# Patient Record
Sex: Male | Born: 1990 | Race: White | Hispanic: No | Marital: Married | Smoking: Current every day smoker
Health system: Southern US, Community
[De-identification: ages and names within clinical notes are randomized; demographics above are authoritative.]

## PROBLEM LIST (undated history)

## (undated) DIAGNOSIS — J45909 Unspecified asthma, uncomplicated: Secondary | ICD-10-CM

## (undated) DIAGNOSIS — F259 Schizoaffective disorder, unspecified: Secondary | ICD-10-CM

---

## 2018-10-05 ENCOUNTER — Other Ambulatory Visit: Payer: Self-pay

## 2018-10-05 ENCOUNTER — Emergency Department
Admission: EM | Admit: 2018-10-05 | Discharge: 2018-10-05 | Discharge status: Against Medical Advice | Payer: Medicaid Other | Attending: Emergency Medicine | Admitting: Emergency Medicine

## 2018-10-05 DIAGNOSIS — H5712 Ocular pain, left eye: Secondary | ICD-10-CM | POA: Insufficient documentation

## 2018-10-05 DIAGNOSIS — Z5321 Procedure and treatment not carried out due to patient leaving prior to being seen by health care provider: Secondary | ICD-10-CM | POA: Diagnosis not present

## 2018-10-05 NOTE — ED Triage Notes (Signed)
Pt has redness and pain in left eye.  Pt unsure if bug flew into eye today.  Pt alert.

## 2018-10-05 NOTE — ED Notes (Signed)
Right eye: 20/50  Left eye: 20/70

## 2018-10-08 ENCOUNTER — Emergency Department
Admission: EM | Admit: 2018-10-08 | Discharge: 2018-10-08 | Disposition: A | Discharge status: Home / Self Care | Payer: Medicaid Other | Attending: Emergency Medicine | Admitting: Emergency Medicine

## 2018-10-08 ENCOUNTER — Other Ambulatory Visit: Payer: Self-pay

## 2018-10-08 ENCOUNTER — Emergency Department: Payer: Medicaid Other

## 2018-10-08 DIAGNOSIS — W010XXA Fall on same level from slipping, tripping and stumbling without subsequent striking against object, initial encounter: Secondary | ICD-10-CM | POA: Diagnosis not present

## 2018-10-08 DIAGNOSIS — S99921A Unspecified injury of right foot, initial encounter: Secondary | ICD-10-CM | POA: Diagnosis present

## 2018-10-08 DIAGNOSIS — Y9389 Activity, other specified: Secondary | ICD-10-CM | POA: Insufficient documentation

## 2018-10-08 DIAGNOSIS — Y998 Other external cause status: Secondary | ICD-10-CM | POA: Insufficient documentation

## 2018-10-08 DIAGNOSIS — Y92828 Other wilderness area as the place of occurrence of the external cause: Secondary | ICD-10-CM | POA: Insufficient documentation

## 2018-10-08 DIAGNOSIS — M79671 Pain in right foot: Secondary | ICD-10-CM

## 2018-10-08 MED ORDER — MELOXICAM 15 MG PO TABS: 15.0000 mg | ORAL_TABLET | Freq: Every day | ORAL | 1 refills | Status: AC

## 2018-10-08 NOTE — ED Triage Notes (Signed)
Patient reports he was walking his dog and slipped and fell.  Reports right foot pain.

## 2018-10-08 NOTE — ED Provider Notes (Signed)
[facility] Emergency Department Provider Note  ____________________________________________  Time seen: Approximately 9:56 PM  I have reviewed the triage vital signs and the nursing notes.   HISTORY  Chief Complaint Foot Pain    HPI Sammuel BailiffCarl Frank Golden is a 28 y.o. male presents to the emergency department with acute right foot pain after patient reports that he slipped and fell while trying [place] [facility] [place]  He reports pain mostly of the right fourth toe.  No numbness or tingling in the right foot.  Patient reports difficulty with ambulation.  No medications have been attempted prior to presenting to the emergency department.        No past medical history on file.  There are no active problems to display for this patient.     Prior to Admission medications   Medication Sig Start Date End Date Taking? Authorizing Provider  meloxicam (MOBIC) 15 MG tablet Take 1 tablet (15 mg total) by mouth daily for 7 days. 10/08/18 10/15/18  Orvil FeilWoods, Maziah Smola M, PA-C    Allergies Patient has no known allergies.  No family history on file.  Social History Social History   Tobacco Use  . Smoking status: Not on file  Substance Use Topics  . Alcohol use: Not on file  . Drug use: Not on file     Review of Systems  Constitutional: No fever/chills Eyes: No visual changes. No discharge ENT: No upper respiratory complaints. Cardiovascular: no chest pain. Respiratory: no cough. No SOB. Gastrointestinal: No abdominal pain.  No nausea, no vomiting.  No diarrhea.  No constipation. Genitourinary: Negative for dysuria. No hematuria Musculoskeletal: Patient has right foot pain.  Skin: Negative for rash, abrasions, lacerations, ecchymosis. Neurological: Negative for headaches, focal weakness or numbness.   ____________________________________________   PHYSICAL EXAM:  VITAL SIGNS: ED Triage Vitals  Enc Vitals Group     BP  10/08/18 2039 126/80     Pulse Rate 10/08/18 2039 82     Resp 10/08/18 2039 18     Temp 10/08/18 2039 98.6 F (37 C)     Temp Source 10/08/18 2039 Oral     SpO2 10/08/18 2039 100 %     Weight --      Height --      Head Circumference --      Peak Flow --      Pain Score 10/08/18 2036 9     Pain Loc --      Pain Edu? --      Excl. in GC? --      Constitutional: Alert and oriented. Well appearing and in no acute distress. Eyes: Conjunctivae are normal. PERRL. EOMI. Head: Atraumatic. Cardiovascular: Normal rate, regular rhythm. Normal S1 and S2.  Good peripheral circulation. Respiratory: Normal respiratory effort without tachypnea or retractions. Lungs CTAB. Good air entry to the bases with no decreased or absent breath sounds. Musculoskeletal: Patient is able to move all 5 right toes.  He has tenderness to palpation along the course of the right fourth toe.  Palpable dorsalis pedis pulse, right. Neurologic:  Normal speech and language. No gross focal neurologic deficits are appreciated.  Skin:  Skin is warm, dry and intact. No rash noted. Psychiatric: Mood and affect are normal. Speech and behavior are normal. Patient exhibits appropriate insight and judgement.   ____________________________________________   LABS (all labs ordered are listed, but only abnormal results are displayed)  Labs Reviewed - No data to display ____________________________________________  EKG   ____________________________________________  RADIOLOGY I personally viewed and evaluated these images as part of my medical decision making, as well as reviewing the written report by the radiologist.  Dg Foot Complete Right  Result Date: 10/08/2018 CLINICAL DATA:  Slipped and fell while walking his dog, RIGHT foot pain EXAM: RIGHT FOOT COMPLETE - 3+ VIEW COMPARISON:  None FINDINGS: Osseous mineralization normal. Joint spaces preserved. Oblique fracture through shaft of proximal phalanx RIGHT fourth toe,  age-indeterminate, potentially old. No additional fracture, dislocation, or bone destruction. IMPRESSION: Age-indeterminate oblique fracture at proximal phalanx RIGHT fourth toe, potentially old; recommend correlation for pain and tenderness at this site to determine if this represents an acute injury. No other focal osseous abnormalities identified. Electronically Signed   By: Ulyses Southward M.D.   On: 10/08/2018 21:16    ____________________________________________    PROCEDURES  Procedure(s) performed:    Procedures    Medications - No data to display   ____________________________________________   INITIAL IMPRESSION / ASSESSMENT AND PLAN / ED COURSE  Pertinent labs & imaging results that were available during my care of the patient were reviewed by me and considered in my medical decision making (see chart for details).  Review of the [place] CSRS was performed in accordance of the [facility] prior to dispensing any controlled drugs.        Assessment and plan Right fourth toe pain Patient presents to the emergency department with acute right foot pain after he tripped earlier today.  X-ray examination is concerning for an age indeterminant fracture along the proximal phalanx of the right fourth toe consistent with distribution of patient's pain.  Toes were buddy taped together and patient was given a postop shoe.  Crutches were also provided.  Patient was discharged with meloxicam and advised to follow-up with podiatry as needed.  All patient questions were answered.   ____________________________________________  FINAL CLINICAL IMPRESSION(S) / ED DIAGNOSES  Final diagnoses:  Foot pain, right      NEW MEDICATIONS STARTED DURING THIS VISIT:  ED Discharge Orders         Ordered    meloxicam (MOBIC) 15 MG tablet  Daily     10/08/18 2147              This chart was dictated using voice recognition software/Dragon. Despite best efforts to proofread, errors can occur  which can change the meaning. Any change was purely unintentional.    Orvil Feil, PA-C 10/08/18 2200    Sharman Cheek, MD 10/10/18 Salley Hews

## 2018-10-21 ENCOUNTER — Emergency Department
Admission: EM | Admit: 2018-10-21 | Discharge: 2018-10-21 | Disposition: A | Discharge status: Home / Self Care | Payer: Medicaid Other | Attending: Emergency Medicine | Admitting: Emergency Medicine

## 2018-10-21 ENCOUNTER — Other Ambulatory Visit: Payer: Self-pay

## 2018-10-21 ENCOUNTER — Encounter: Payer: Self-pay | Admitting: Emergency Medicine

## 2018-10-21 DIAGNOSIS — K625 Hemorrhage of anus and rectum: Secondary | ICD-10-CM | POA: Diagnosis present

## 2018-10-21 DIAGNOSIS — Z5321 Procedure and treatment not carried out due to patient leaving prior to being seen by health care provider: Secondary | ICD-10-CM | POA: Insufficient documentation

## 2018-10-21 HISTORY — DX: Unspecified asthma, uncomplicated: J45.909

## 2018-10-21 LAB — CBC
HCT: 42 % (ref 39.0–52.0)
Hemoglobin: 14.6 g/dL (ref 13.0–17.0)
MCH: 29.7 pg (ref 26.0–34.0)
MCHC: 34.8 g/dL (ref 30.0–36.0)
MCV: 85.4 fL (ref 80.0–100.0)
Platelets: 207 10*3/uL (ref 150–400)
RBC: 4.92 MIL/uL (ref 4.22–5.81)
RDW: 11.9 % (ref 11.5–15.5)
WBC: 7.9 10*3/uL (ref 4.0–10.5)
nRBC: 0 % (ref 0.0–0.2)

## 2018-10-21 LAB — TYPE AND SCREEN
ABO/RH(D): O POS
Antibody Screen: NEGATIVE

## 2018-10-21 LAB — COMPREHENSIVE METABOLIC PANEL
ALT: 26 U/L (ref 0–44)
AST: 29 U/L (ref 15–41)
Albumin: 4.4 g/dL (ref 3.5–5.0)
Alkaline Phosphatase: 66 U/L (ref 38–126)
Anion gap: 9 (ref 5–15)
BUN: 22 mg/dL — ABNORMAL HIGH (ref 6–20)
CO2: 25 mmol/L (ref 22–32)
Calcium: 9.1 mg/dL (ref 8.9–10.3)
Chloride: 107 mmol/L (ref 98–111)
Creatinine, Ser: 0.81 mg/dL (ref 0.61–1.24)
GFR calc Af Amer: >60 mL/min (ref >60)
GFR calc non Af Amer: >60 mL/min (ref >60)
Glucose, Bld: 105 mg/dL — ABNORMAL HIGH (ref 70–99)
Potassium: 3.9 mmol/L (ref 3.5–5.1)
Sodium: 141 mmol/L (ref 135–145)
Total Bilirubin: 1.2 mg/dL (ref 0.3–1.2)
Total Protein: 6.9 g/dL (ref 6.5–8.1)

## 2018-10-21 NOTE — ED Triage Notes (Signed)
Pt presents with rectal bleeding since this morning. Pt states it is "a good bit" of blood and it will not stop. Pt denies blood discharge except with bowel movement. Denies ever having this before. Pt fell down stairs and was seen at [facility] yesterday; treated for broken toe. NAD noted.

## 2018-10-21 NOTE — ED Notes (Signed)
Patient came up to the first nurse desk and states he would like to leave and go to a different ER.  This RN re-checked patient's vital signs which are stable at this time.  Patient denies an increase in pain and denies feeling light headed.  This RN encouraged patient to stay and explained that he may have to wait at another facility as well and that we would see him as soon as possible.  Patient stated he wanted to leave anyway.

## 2018-10-24 ENCOUNTER — Telehealth: Payer: Self-pay | Admitting: Emergency Medicine

## 2018-10-24 NOTE — Telephone Encounter (Signed)
Called patient due to lwot to inquire about condition and follow up plans.  Number is not in service.  

## 2018-11-10 ENCOUNTER — Emergency Department: Payer: Medicaid Other

## 2018-11-10 ENCOUNTER — Emergency Department
Admission: EM | Admit: 2018-11-10 | Discharge: 2018-11-12 | Disposition: A | Discharge status: Other Acute Inpatient Hospital | Payer: Medicaid Other | Attending: Student in an Organized Health Care Education/Training Program | Admitting: Student in an Organized Health Care Education/Training Program

## 2018-11-10 DIAGNOSIS — Z20828 Contact with and (suspected) exposure to other viral communicable diseases: Secondary | ICD-10-CM | POA: Diagnosis not present

## 2018-11-10 DIAGNOSIS — R45851 Suicidal ideations: Secondary | ICD-10-CM

## 2018-11-10 DIAGNOSIS — F259 Schizoaffective disorder, unspecified: Secondary | ICD-10-CM | POA: Diagnosis present

## 2018-11-10 DIAGNOSIS — F17290 Nicotine dependence, other tobacco product, uncomplicated: Secondary | ICD-10-CM | POA: Diagnosis not present

## 2018-11-10 DIAGNOSIS — Z046 Encounter for general psychiatric examination, requested by authority: Secondary | ICD-10-CM | POA: Diagnosis present

## 2018-11-10 DIAGNOSIS — J45909 Unspecified asthma, uncomplicated: Secondary | ICD-10-CM | POA: Diagnosis not present

## 2018-11-10 DIAGNOSIS — F29 Unspecified psychosis not due to a substance or known physiological condition: Secondary | ICD-10-CM

## 2018-11-10 DIAGNOSIS — F251 Schizoaffective disorder, depressive type: Secondary | ICD-10-CM | POA: Diagnosis not present

## 2018-11-10 HISTORY — DX: Schizoaffective disorder, unspecified: F25.9

## 2018-11-10 LAB — COMPREHENSIVE METABOLIC PANEL
ALT: 26 U/L (ref 0–44)
AST: 26 U/L (ref 15–41)
Albumin: 4.5 g/dL (ref 3.5–5.0)
Alkaline Phosphatase: 74 U/L (ref 38–126)
Anion gap: 11 (ref 5–15)
BUN: 18 mg/dL (ref 6–20)
CO2: 24 mmol/L (ref 22–32)
Calcium: 9.3 mg/dL (ref 8.9–10.3)
Chloride: 109 mmol/L (ref 98–111)
Creatinine, Ser: 1.03 mg/dL (ref 0.61–1.24)
GFR calc Af Amer: >60 mL/min (ref >60)
GFR calc non Af Amer: >60 mL/min (ref >60)
Glucose, Bld: 105 mg/dL — ABNORMAL HIGH (ref 70–99)
Potassium: 3.3 mmol/L — ABNORMAL LOW (ref 3.5–5.1)
Sodium: 144 mmol/L (ref 135–145)
Total Bilirubin: 0.8 mg/dL (ref 0.3–1.2)
Total Protein: 7 g/dL (ref 6.5–8.1)

## 2018-11-10 LAB — CBC
HCT: 43.2 % (ref 39.0–52.0)
Hemoglobin: 14.9 g/dL (ref 13.0–17.0)
MCH: 29.4 pg (ref 26.0–34.0)
MCHC: 34.5 g/dL (ref 30.0–36.0)
MCV: 85.2 fL (ref 80.0–100.0)
Platelets: 199 10*3/uL (ref 150–400)
RBC: 5.07 MIL/uL (ref 4.22–5.81)
RDW: 12.1 % (ref 11.5–15.5)
WBC: 8.5 10*3/uL (ref 4.0–10.5)
nRBC: 0 % (ref 0.0–0.2)

## 2018-11-10 LAB — ETHANOL: Alcohol, Ethyl (B): <10 mg/dL (ref <10)

## 2018-11-10 LAB — ACETAMINOPHEN LEVEL: Acetaminophen (Tylenol), Serum: <10 ug/mL — ABNORMAL LOW (ref 10–30)

## 2018-11-10 LAB — SALICYLATE LEVEL: Salicylate Lvl: <7 mg/dL (ref 2.8–30.0)

## 2018-11-10 MED ORDER — ZIPRASIDONE MESYLATE 20 MG IM SOLR
20.0000 mg | Freq: Once | INTRAMUSCULAR | Status: AC
  Administered 2018-11-10: 22:00:00 20 mg via INTRAMUSCULAR
  Filled 2018-11-10: qty 20

## 2018-11-10 NOTE — ED Notes (Signed)
Patient cooperative with lab work. Maintained on 15 minute checks.

## 2018-11-10 NOTE — Consult Note (Signed)
Hunter Sawyer Psychiatry Consult   Reason for Consult:  Suicidal Ideation Referring Physician:  Dr. Quentin Cornwall Patient Identification: Hunter Sawyer MRN:  [id number] Principal Diagnosis: <principal problem not specified> Diagnosis:  Active Problems:   Schizoaffective disorder ([place])   Total Time spent with patient: 1 hour  Subjective: "I want to kill myself.  It would be in everybody's best interest if you let me go." Hunter Sawyer is a 28 y.o. male patient presented to [facility] ED via law enforcement under involuntary commitment status (IVC)."I take Invega shots."  "The last time I had a was 4 to 5 months ago."  "I am not going to take it anymore because it does not work for me."  The patient stated he moved from [place] 4 to 5 months ago and was seen at [facility] for mental health care.  He discussed he stopped going due to him not feeling like he needs the care.  He discussed he has been on lots of medications that did not work for him.  The patient admits to hearing voices since the age of 28 years old.  He discussed "hearing voices is not the problem.  The problem is how you are treated by people.  It is hard when people look at you, like you are strange and talk about you behind your back like you are crazy."  The patient was seen face-to-face by this provider; chart reviewed and consulted with Dr. Quentin Cornwall on call on 11/10/2018 due to the care of the patient. It was discussed with the provider that the patient does meet criteria to be admitted to the inpatient unit.  On evaluation the patient is alert and oriented x4, uncooperative, mood as  though he is irrated. Patient is rubbing his hands together, staring off, presenting to be easily angered. The patient does appear to be responding to internal stimuli and presenting with delusional thinking. The patient admits to auditory hallucinations but denies visual hallucinations. The patient admits to suicidal, homicidal and self-harm ideations.   He refused to voice a plan, who he is homicidal towards, and while the patient is presenting with psychotic and paranoid behaviors. During an encounter with the patient, he refused to answer questions appropriately.  Collateral was obtained by patient's wife Larene Beach [phone]) who expresses concerns for patient due to him being non-complaint with medications.  The patient wife states that he does not want to be on medication but understands that he needs to be on some type of medications.  She discussed that she and the patient are from [facility] and they met in a psychiatric facility.  She discussed that they have been married almost a year.  She states he has never been violent to her but admits he can get angry at times.  The patient discussed she knows he has been stressed due to financial reasons.  And he is supposed to start a job in the morning November 11, 2018.  She discussed the patient has an 28-year-old daughter who he has gone is seen once.  Plan: The patient is a safety risk to self and others and currently meets criteria for psychiatric inpatient admission for stabilization and treatment.   HPI: Per Dr. Quentin Cornwall; Hunter Sawyer is a 28 y.o. male below listed past medical history presents to the ER under custody with [place] [employer] to be evaluated [facility] reportedly having increasing hallucinations telling him to commit suicide by cop.  Patient denies any drug use.  States  last time he used cocaine was 4 months ago.  Denies any headache neck pain, shortness of breath, congestion, cough, nausea, vomiting, diarrhea, dysuria.  Past Psychiatric History:  Schizoaffective disorder ([place])  Risk to Self:  Yes Risk to Others:  Yes Prior Inpatient Therapy:  Yes Prior Outpatient Therapy:  Yes  Past Medical History:  Past Medical History:  Diagnosis Date  . Asthma   . Schizoaffective disorder ([place])    History reviewed. No pertinent surgical history. Family History: No  family history on file. Family Psychiatric  History: Patient refused to give information Social History:  Unknown Social History   Substance and Sexual Activity  Alcohol Use Not on file   Comment: occasionally     Social History   Substance and Sexual Activity  Drug Use Not Currently    Social History   Socioeconomic History  . Marital status: Married    Spouse name: Not on file  . Number of children: Not on file  . Years of education: Not on file  . Highest education level: Not on file  Occupational History  . Not on file  Social Needs  . Financial resource strain: Not on file  . Food insecurity:    Worry: Not on file    Inability: Not on file  . Transportation needs:    Medical: Not on file    Non-medical: Not on file  Tobacco Use  . Smoking status: Current Every Day Smoker    Types: E-cigarettes  . Smokeless tobacco: Never Used  Substance and Sexual Activity  . Alcohol use: Not on file    Comment: occasionally  . Drug use: Not Currently  . Sexual activity: Not on file  Lifestyle  . Physical activity:    Days per week: Not on file    Minutes per session: Not on file  . Stress: Not on file  Relationships  . Social connections:    Talks on phone: Not on file    Gets together: Not on file    Attends religious service: Not on file    Active member of club or organization: Not on file    Attends meetings of clubs or organizations: Not on file    Relationship status: Not on file  Other Topics Concern  . Not on file  Social History Narrative  . Not on file   Additional Social History:    Allergies:  No Known Allergies  Labs:  Results for orders placed or performed during the hospital encounter of 11/10/18 (from the past 48 hour(s))  Comprehensive metabolic panel     Status: Abnormal   Collection Time: 11/10/18 10:30 PM  Result Value Ref Range   Sodium 144 135 - 145 mmol/L   Potassium 3.3 (L) 3.5 - 5.1 mmol/L   Chloride 109 98 - 111 mmol/L   CO2 24  22 - 32 mmol/L   Glucose, Bld 105 (H) 70 - 99 mg/dL   BUN 18 6 - 20 mg/dL   Creatinine, Ser 1.03 0.61 - 1.24 mg/dL   Calcium 9.3 8.9 - 10.3 mg/dL   Total Protein 7.0 6.5 - 8.1 g/dL   Albumin 4.5 3.5 - 5.0 g/dL   AST 26 15 - 41 U/L   ALT 26 0 - 44 U/L   Alkaline Phosphatase 74 38 - 126 U/L   Total Bilirubin 0.8 0.3 - 1.2 mg/dL   GFR calc non Af Amer >60 >60 mL/min   GFR calc Af Amer >60 >60 mL/min   Anion  gap 11 5 - 15    Comment: Performed at [facility], [address] [place], [place] [address]  Ethanol     Status: None   Collection Time: 11/10/18 10:30 PM  Result Value Ref Range   Alcohol, Ethyl (B) <10 <10 mg/dL    Comment: (NOTE) Lowest detectable limit for serum alcohol is 10 mg/dL. For medical purposes only. Performed at [facility], [address] [place], [place] [address]   Salicylate level     Status: None   Collection Time: 11/10/18 10:30 PM  Result Value Ref Range   Salicylate Lvl <1.4 2.8 - 30.0 mg/dL    Comment: Performed at [facility], [address] [place], [place] [address]  Acetaminophen level     Status: Abnormal   Collection Time: 11/10/18 10:30 PM  Result Value Ref Range   Acetaminophen (Tylenol), Serum <10 (L) 10 - 30 ug/mL    Comment: (NOTE) Therapeutic concentrations vary significantly. A range of 10-30 ug/mL  may be an effective concentration for many patients. However, some  are best treated at concentrations outside of this range. Acetaminophen concentrations >150 ug/mL at 4 hours after ingestion  and >50 ug/mL at 12 hours after ingestion are often associated with  toxic reactions. Performed at [facility], [address] [place], [place] [address]   cbc     Status: None   Collection Time: 11/10/18 10:30 PM  Result Value Ref Range   WBC 8.5 4.0 - 10.5 K/uL   RBC 5.07 4.22 - 5.81 MIL/uL   Hemoglobin 14.9 13.0 - 17.0 g/dL   HCT 43.2 39.0 - 52.0 %   MCV 85.2 80.0 - 100.0 fL   MCH 29.4 26.0 -  34.0 pg   MCHC 34.5 30.0 - 36.0 g/dL   RDW 12.1 11.5 - 15.5 %   Platelets 199 150 - 400 K/uL   nRBC 0.0 0.0 - 0.2 %    Comment: Performed at [facility], [address] [place], [place] [address]    No current facility-administered medications for this encounter.    No current outpatient medications on file.    Musculoskeletal: Strength & Muscle Tone: within normal limits Gait & Station: normal Patient leans: Right  Psychiatric Specialty Exam: Physical Exam  Nursing note and vitals reviewed. Constitutional: He is oriented to person, place, and time. He appears well-developed and well-nourished.  HENT:  Head: Normocephalic and atraumatic.  Eyes: Pupils are equal, round, and reactive to light. Conjunctivae and EOM are normal.  Neck: Normal range of motion. Neck supple.  Cardiovascular: Normal rate and regular rhythm.  Respiratory: Effort normal and breath sounds normal.  Musculoskeletal: Normal range of motion.  Neurological: He is alert and oriented to person, place, and time.  Skin: Skin is warm and dry.    Review of Systems  Psychiatric/Behavioral: Positive for depression, hallucinations and suicidal ideas. The patient is nervous/anxious.   All other systems reviewed and are negative.   Blood pressure 126/82, pulse 81, temperature 100.3 F (37.9 C), temperature source Oral, resp. rate 15, height '5\' 7"'  (1.702 m), weight 91.2 kg, SpO2 99 %.Body mass index is 31.48 kg/m.  General Appearance: Bizarre, Fairly Groomed and Guarded  Eye Contact:  Minimal  Speech:  Slow  Volume:  Decreased  Mood:  Angry, Anxious, Depressed, Euphoric, Hopeless, Irritable and Worthless  Affect:  Blunt, Constricted, Depressed, Inappropriate and Restricted  Thought Process:  Disorganized  Orientation:  Full (Time, Place, and Person)  Thought Content:  Hallucinations: Auditory, Obsessions and Paranoid Ideation  Suicidal Thoughts:  Yes.  with intent/plan  Homicidal Thoughts:  Yes.   without intent/plan  Memory:  Immediate;   Fair Recent;   Fair  Judgement:  Impaired  Insight:  Lacking  Psychomotor Activity:  Normal  Concentration:  Concentration: Poor and Attention Span: Poor  Recall:  [employer] of Knowledge:  Fair  Language:  Fair  Akathisia:  NA  Handed:  Right  AIMS (if indicated):     Assets:  Social Support  ADL's:  Intact  Cognition:  Impaired,  Mild  Sleep:   Poor     Treatment Plan Summary: Daily contact with patient to assess and evaluate symptoms and progress in treatment and Medication management  Disposition: Recommend psychiatric Inpatient admission when medically cleared. Supportive therapy provided about ongoing stressors.  Patient does meet criteria for psychiatric inpatient admission. Patient has a lot of high-risk factors. Therefore; this provider is recommending inpatient psychiatric care.   Lamont Dowdy, NP 11/10/2018 11:32 PM

## 2018-11-10 NOTE — ED Provider Notes (Signed)
[facility] Emergency Department Provider Note    First MD Initiated Contact with Patient 11/10/18 2030     (approximate)  I have reviewed the triage vital signs and the nursing notes.   HISTORY  Chief Complaint Suicidal    HPI Hunter Sawyer is a 28 y.o. male below listed past medical history presents to the ER under custody with [employer] to be evaluated [facility] reportedly having increasing hallucinations telling him to commit suicide by cop.  Patient denies any drug use.  States last time he used cocaine was 4 months ago.  Denies any headache neck pain, shortness of breath, congestion, cough, nausea, vomiting, diarrhea, dysuria.    Past Medical History:  Diagnosis Date  . Asthma   . Schizoaffective disorder (HCC)    No family history on file. History reviewed. No pertinent surgical history. There are no active problems to display for this patient.     Prior to Admission medications   Not on File    Allergies Patient has no known allergies.    Social History Social History   Tobacco Use  . Smoking status: Current Every Day Smoker    Types: E-cigarettes  . Smokeless tobacco: Never Used  Substance Use Topics  . Alcohol use: Not on file    Comment: occasionally  . Drug use: Not Currently    Review of Systems Patient denies headaches, rhinorrhea, blurry vision, numbness, shortness of breath, chest pain, edema, cough, abdominal pain, nausea, vomiting, diarrhea, dysuria, fevers, rashes or hallucinations unless otherwise stated above in HPI. ____________________________________________   PHYSICAL EXAM:  VITAL SIGNS: Vitals:   11/10/18 1958 11/10/18 2121  BP: 126/82   Pulse: 81   Resp: 15   Temp: (!) 100.4 F (38 C) 100.3 F (37.9 C)  SpO2: 99%     Constitutional: Alert and oriented. Resting comfortably in bed but able to ambulate about room in NAD Eyes: Conjunctivae are normal.  Head: Atraumatic. Nose:  No congestion/rhinnorhea. Mouth/Throat: Mucous membranes are moist.   Neck: No stridor. Painless ROM. No meningismus Cardiovascular: Normal rate, regular rhythm. Grossly normal heart sounds.  Good peripheral circulation. Respiratory: Normal respiratory effort.  No retractions. Lungs CTAB. Gastrointestinal: Soft and nontender. No distention. No abdominal bruits. No CVA tenderness. Genitourinary:  Musculoskeletal: No lower extremity tenderness nor edema.  No joint effusions. Neurologic:   No gross focal neurologic deficits are appreciated. No facial droop Skin:  Skin is warm, dry and intact. No rash noted. Psychiatric: anxious appearing, paranoid,  ____________________________________________   LABS (all labs ordered are listed, but only abnormal results are displayed)  No results found for this or any previous visit (from the past 24 hour(s)). ____________________________________________ ____________________________________________  RADIOLOGY  I personally reviewed all radiographic images ordered to evaluate for the above acute complaints and reviewed radiology reports and findings.  These findings were personally discussed with the patient.  Please see medical record for radiology report.  ____________________________________________   PROCEDURES  Procedure(s) performed:  .Critical Care Performed by: Willy Eddy, MD Authorized by: Willy Eddy, MD   Critical care provider statement:    Critical care time (minutes):  30   Critical care time was exclusive of:  Separately billable procedures and treating other patients   Critical care was necessary to treat or prevent imminent or life-threatening deterioration of the following conditions:  Toxidrome   Critical care was time spent personally by me on the following activities:  Development of treatment plan with patient or surrogate, discussions  with consultants, evaluation of patient's response to treatment, examination  of patient, obtaining history from patient or surrogate, ordering and performing treatments and interventions, ordering and review of laboratory studies, ordering and review of radiographic studies, pulse oximetry, re-evaluation of patient's condition and review of old charts      Critical Care performed: yes ____________________________________________   INITIAL IMPRESSION / ASSESSMENT AND PLAN / ED COURSE  Pertinent labs & imaging results that were available during my care of the patient were reviewed by me and considered in my medical decision making (see chart for details).   DDX: Psychosis, delirium, medication effect, noncompliance, polysubstance abuse, Si, Hi, depression   Hunter Sawyer is a 28 y.o. who presents to the ED with SI and hallucinations as described above.  Patient will be continue with IVC.  Patient uncooperative and refusing medical work-up.  I am concerned about his temperature although he clinically appears nontoxic.  Doubt sepsis.  Does have underlying psychiatric illness therefore doubt encephalitis but will obtain blood work.  Patient will be provided calming agent in the form of IM Geodon so that we can medically evaluate him to exclude underlying medical condition.  Will be placed under ivc.  Patient nontoxic appearing.  No physical exam findings or hemodynamic instability suggest NMS or serotonin syndrome.  Possible heat illness given today was the hottest day of the year thus far.  Have low suspicion for encephalitis or meningitis.  Blood will be sent for the by differential.  Psychiatry will be consulted.     The patient was evaluated in Emergency Department today for the symptoms described in the history of present illness. He/she was evaluated in the context of the global COVID-19 pandemic, which necessitated consideration that the patient might be at risk for infection with the SARS-CoV-2 virus that causes COVID-19. Institutional protocols and algorithms  that pertain to the evaluation of patients at risk for COVID-19 are in a state of rapid change based on information released by regulatory bodies including the CDC and federal and state organizations. These policies and algorithms were followed during the patient's care in the ED.  As part of my medical decision making, I reviewed the following data within the electronic MEDICAL RECORD NUMBER Nursing notes reviewed and incorporated, Labs reviewed, notes from prior ED visits and [place] Controlled Substance Database   ____________________________________________   FINAL CLINICAL IMPRESSION(S) / ED DIAGNOSES  Final diagnoses:  Suicidal ideation  Psychosis, unspecified psychosis type (HCC)      NEW MEDICATIONS STARTED DURING THIS VISIT:  New Prescriptions   No medications on file     Note:  This document was prepared using Dragon voice recognition software and may include unintentional dictation errors.       Willy Eddyobinson, Lyna Laningham, MD 11/10/18 2248

## 2018-11-10 NOTE — ED Notes (Signed)
Pt sitting in room, highly agitated. Refusing lab draw.

## 2018-11-10 NOTE — ED Notes (Signed)
Officer gave EDT bag of belongings that contained a wallet, a cell phone, 1 key, Drivers ID and a vape pen.

## 2018-11-10 NOTE — ED Notes (Addendum)
This RN and Myah NT, with 2 [employer] officers as witnesses per patient request changed patient into hospital provided scrubs. Patient's belonging's placed into labeled bag. Patient's belonging's include:  1 black t-shirt, 1 pair black shoes, 1 pair black socks, green belt, blue jeans, underwear.   Patient is still wearing wedding band. Patient stated: "I will beat somebody if you take my ring"

## 2018-11-10 NOTE — ED Notes (Signed)
Patient would not consent to lab work or COIVD-19 test. RN approached patient to ask if he would willingly take injection. Patient refused, "I'm a free American. I'm not even agitated..I don't need it."    Officers had to hold patient to administer medication. Patient fought Advertising account executive) officers.   IM was administered as ordered.

## 2018-11-10 NOTE — ED Triage Notes (Signed)
Patient coming in under IVC with [employer] for SI. Patient is hearing voices telling him to kill himself via cop.

## 2018-11-10 NOTE — ED Notes (Signed)
Asked pt if he wanted a meal tray, pt said he would only eat if he could prepare the food himself.

## 2018-11-10 NOTE — ED Provider Notes (Deleted)
[facility] Emergency Department Provider Note    First MD Initiated Contact with Patient 11/10/18 2030     (approximate)  I have reviewed the triage vital signs and the nursing notes.   HISTORY  Chief Complaint Suicidal    HPI Hunter Sawyer is a 28 y.o. male below listed past medical history presents to the ER under custody with [employer] to be evaluated [facility] reportedly having increasing hallucinations telling him to commit suicide by cop.  Patient denies any drug use.  States last time he used cocaine was 4 months ago.  Denies any headache neck pain, shortness of breath, congestion, cough, nausea, vomiting, diarrhea, dysuria.    Past Medical History:  Diagnosis Date  . Asthma   . Schizoaffective disorder (HCC)    No family history on file. History reviewed. No pertinent surgical history. There are no active problems to display for this patient.     Prior to Admission medications   Not on File    Allergies Patient has no known allergies.    Social History Social History   Tobacco Use  . Smoking status: Current Every Day Smoker    Types: E-cigarettes  . Smokeless tobacco: Never Used  Substance Use Topics  . Alcohol use: Not on file    Comment: occasionally  . Drug use: Not Currently    Review of Systems Patient denies headaches, rhinorrhea, blurry vision, numbness, shortness of breath, chest pain, edema, cough, abdominal pain, nausea, vomiting, diarrhea, dysuria, fevers, rashes or hallucinations unless otherwise stated above in HPI. ____________________________________________   PHYSICAL EXAM:  VITAL SIGNS: Vitals:   11/10/18 1958 11/10/18 2121  BP: 126/82   Pulse: 81   Resp: 15   Temp: (!) 100.4 F (38 C) 100.3 F (37.9 C)  SpO2: 99%     Constitutional: Alert and oriented. Resting comfortably in bed but able to ambulate about room in NAD Eyes: Conjunctivae are normal.  Head: Atraumatic. Nose:  No congestion/rhinnorhea. Mouth/Throat: Mucous membranes are moist.   Neck: No stridor. Painless ROM. No meningismus Cardiovascular: Normal rate, regular rhythm. Grossly normal heart sounds.  Good peripheral circulation. Respiratory: Normal respiratory effort.  No retractions. Lungs CTAB. Gastrointestinal: Soft and nontender. No distention. No abdominal bruits. No CVA tenderness. Genitourinary:  Musculoskeletal: No lower extremity tenderness nor edema.  No joint effusions. Neurologic:   No gross focal neurologic deficits are appreciated. No facial droop Skin:  Skin is warm, dry and intact. No rash noted. Psychiatric: anxious appearing, paranoid,  ____________________________________________   LABS (all labs ordered are listed, but only abnormal results are displayed)  No results found for this or any previous visit (from the past 24 hour(s)). ____________________________________________ ____________________________________________  RADIOLOGY  I personally reviewed all radiographic images ordered to evaluate for the above acute complaints and reviewed radiology reports and findings.  These findings were personally discussed with the patient.  Please see medical record for radiology report.  ____________________________________________   PROCEDURES  Procedure(s) performed:  .Critical Care Performed by: Willy Eddy, MD Authorized by: Willy Eddy, MD   Critical care provider statement:    Critical care time (minutes):  30   Critical care time was exclusive of:  Separately billable procedures and treating other patients   Critical care was necessary to treat or prevent imminent or life-threatening deterioration of the following conditions:  Toxidrome   Critical care was time spent personally by me on the following activities:  Development of treatment plan with patient or surrogate, discussions  with consultants, evaluation of patient's response to treatment, examination  of patient, obtaining history from patient or surrogate, ordering and performing treatments and interventions, ordering and review of laboratory studies, ordering and review of radiographic studies, pulse oximetry, re-evaluation of patient's condition and review of old charts      Critical Care performed: yes ____________________________________________   INITIAL IMPRESSION / ASSESSMENT AND PLAN / ED COURSE  Pertinent labs & imaging results that were available during my care of the patient were reviewed by me and considered in my medical decision making (see chart for details).   DDX: Psychosis, delirium, medication effect, noncompliance, polysubstance abuse, Si, Hi, depression   Hunter Sawyer is a 28 y.o. who presents to the ED with SI and hallucinations as described above.  Patient will be continue with IVC.  Patient uncooperative and refusing medical work-up.  I am concerned about his temperature although he clinically appears nontoxic.  Doubt sepsis.  Does have underlying psychiatric illness therefore doubt encephalitis but will obtain blood work.  Patient will be provided calming agent in the form of IM Geodon so that we can medically evaluate him to exclude underlying medical condition.  Will be placed under ivc.       The patient was evaluated in Emergency Department today for the symptoms described in the history of present illness. He/she was evaluated in the context of the global COVID-19 pandemic, which necessitated consideration that the patient might be at risk for infection with the SARS-CoV-2 virus that causes COVID-19. Institutional protocols and algorithms that pertain to the evaluation of patients at risk for COVID-19 are in a state of rapid change based on information released by regulatory bodies including the CDC and federal and state organizations. These policies and algorithms were followed during the patient's care in the ED.  As part of my medical decision  making, I reviewed the following data within the electronic MEDICAL RECORD NUMBER Nursing notes reviewed and incorporated, Labs reviewed, notes from prior ED visits and [place] Controlled Substance Database   ____________________________________________   FINAL CLINICAL IMPRESSION(S) / ED DIAGNOSES  Final diagnoses:  Suicidal ideation  Psychosis, unspecified psychosis type (HCC)      NEW MEDICATIONS STARTED DURING THIS VISIT:  New Prescriptions   No medications on file     Note:  This document was prepared using Dragon voice recognition software and may include unintentional dictation errors.    Willy Eddy, MD 11/10/18 2245

## 2018-11-10 NOTE — BH Assessment (Signed)
Assessment Note  Hunter Sawyer is an 28 y.o. male in to the ED under IVC with [employer] for SI. Patient is hearing voices telling him to kill himself via cop. Pt is uncooperative and agitated refusing medical attention and testing at this time. Pt receiving IM for agitation. TTS will try to assess pt at a later time.    Diagnosis: Schizoaffective disorder  Past Medical History:  Past Medical History:  Diagnosis Date  . Asthma   . Schizoaffective disorder (HCC)     History reviewed. No pertinent surgical history.  Family History: No family history on file.  Social History:  reports that he has been smoking e-cigarettes. He has never used smokeless tobacco. He reports previous drug use. No history on file for alcohol.  Additional Social History:     CIWA: CIWA-Ar BP: 126/82 Pulse Rate: 81 COWS:    Allergies: No Known Allergies  Home Medications: (Not in a hospital admission)   OB/GYN Status:  No LMP for male patient.  General Assessment Data Assessment unable to be completed: Yes Reason for not completing assessment: Pt agitated and uncooperatve                                                 Advance Directives (For Healthcare) Does Patient Have a Medical Advance Directive?: No Would patient like information on creating a medical advance directive?: No - Patient declined          Disposition:     On Site Evaluation by:   Reviewed with Physician:    Claron Rosencrans D Constant [place] 11/10/2018 9:45 PM

## 2018-11-11 LAB — URINE DRUG SCREEN, QUALITATIVE (ARMC ONLY)
Amphetamines, Ur Screen: NOT DETECTED
Barbiturates, Ur Screen: NOT DETECTED
Benzodiazepine, Ur Scrn: NOT DETECTED
Cannabinoid 50 Ng, Ur ~~LOC~~: NOT DETECTED
Cocaine Metabolite,Ur ~~LOC~~: NOT DETECTED
MDMA (Ecstasy)Ur Screen: NOT DETECTED
Methadone Scn, Ur: NOT DETECTED
Opiate, Ur Screen: NOT DETECTED
Phencyclidine (PCP) Ur S: NOT DETECTED
Tricyclic, Ur Screen: NOT DETECTED

## 2018-11-11 LAB — SARS CORONAVIRUS 2 BY RT PCR (HOSPITAL ORDER, PERFORMED IN ~~LOC~~ HOSPITAL LAB): SARS Coronavirus 2: NEGATIVE

## 2018-11-11 LAB — URINALYSIS, COMPLETE (UACMP) WITH MICROSCOPIC
Bacteria, UA: NONE SEEN
Bilirubin Urine: NEGATIVE
Glucose, UA: NEGATIVE mg/dL
Hgb urine dipstick: NEGATIVE
Ketones, ur: NEGATIVE mg/dL
Leukocytes,Ua: NEGATIVE
Nitrite: NEGATIVE
Protein, ur: 30 mg/dL — AB
Specific Gravity, Urine: 1.029 (ref 1.005–1.030)
pH: 5 (ref 5.0–8.0)

## 2018-11-11 LAB — LACTIC ACID, PLASMA: Lactic Acid, Venous: 1.3 mmol/L (ref 0.5–1.9)

## 2018-11-11 MED ORDER — HALOPERIDOL LACTATE 5 MG/ML IJ SOLN
5.0000 mg | Freq: Two times a day (BID) | INTRAMUSCULAR | Status: DC
  Administered 2018-11-11: 22:00:00 5 mg via INTRAMUSCULAR
  Filled 2018-11-11: qty 1

## 2018-11-11 MED ORDER — LORAZEPAM 2 MG/ML IJ SOLN: 2.0000 mg | Freq: Once | INTRAMUSCULAR | Status: DC | PRN

## 2018-11-11 MED ORDER — ACETAMINOPHEN 500 MG PO TABS
1000.0000 mg | ORAL_TABLET | Freq: Once | ORAL | Status: AC
  Administered 2018-11-11: 1000 mg via ORAL
  Filled 2018-11-11: qty 2

## 2018-11-11 MED ORDER — HALOPERIDOL 5 MG PO TABS: 5.0000 mg | ORAL_TABLET | Freq: Two times a day (BID) | ORAL | Status: DC

## 2018-11-11 MED ORDER — HALOPERIDOL LACTATE 2 MG/ML PO CONC
5.0000 mg | Freq: Once | ORAL | Status: DC | PRN
  Filled 2018-11-11: qty 2.5

## 2018-11-11 MED ORDER — DIPHENHYDRAMINE HCL 50 MG/ML IJ SOLN: 50.0000 mg | Freq: Once | INTRAMUSCULAR | Status: DC | PRN

## 2018-11-11 MED ORDER — HALOPERIDOL 5 MG PO TABS
5.0000 mg | ORAL_TABLET | Freq: Two times a day (BID) | ORAL | Status: DC
  Filled 2018-11-11: qty 1

## 2018-11-11 MED ORDER — BENZTROPINE MESYLATE 1 MG PO TABS
1.0000 mg | ORAL_TABLET | Freq: Every day | ORAL | Status: DC
  Filled 2018-11-11: qty 1

## 2018-11-11 MED ORDER — RISPERIDONE 1 MG PO TABS: 2.0000 mg | ORAL_TABLET | Freq: Two times a day (BID) | ORAL | Status: DC

## 2018-11-11 MED ORDER — IBUPROFEN 600 MG PO TABS
600.0000 mg | ORAL_TABLET | Freq: Four times a day (QID) | ORAL | Status: DC | PRN
  Administered 2018-11-11: 19:00:00 600 mg via ORAL
  Filled 2018-11-11: qty 1

## 2018-11-11 NOTE — ED Notes (Signed)
When I asked question for suicide assessment, patient states that is is not going to answer.  He said he is going to take the fifth.  He refused the haldol and congentin.  He says he doesn't need it because he is calm now.  He stands at his doorway with arms folded, and he is coopertive .

## 2018-11-11 NOTE — ED Notes (Signed)
Pt states he has a headache.  Ibuprofen 600 mg PO given.

## 2018-11-11 NOTE — ED Notes (Signed)
Patient in room sitting in doorway at this time.

## 2018-11-11 NOTE — ED Notes (Signed)
Patient initially refused the blood draw for lactate and covid test, but agreed after md came and spoke with him.  He did get [place] a urine specimen which was sent to lab.

## 2018-11-11 NOTE — ED Notes (Signed)
Pt. States "I have a headache"  Pt. States he was given ibuprofen about 20 minutes ago.  Pt. Calm and cooperative at this time.

## 2018-11-11 NOTE — ED Notes (Signed)
Pt given snack upon request. Pt calm and cooperative at this time. Maintained on 15 minute checks.

## 2018-11-11 NOTE — ED Notes (Signed)
Patient assigned to appropriate care area. Patient oriented to unit/care area: Informed that, for their safety, care areas are designed for safety and monitored by security cameras at all times.  Patient verbalizes understanding, and verbal contract for safety obtained.

## 2018-11-11 NOTE — ED Provider Notes (Signed)
-----------------------------------------   6:08 AM on 11/11/2018 -----------------------------------------   Blood pressure 126/82, pulse 81, temperature 100.3 F (37.9 C), temperature source Oral, resp. rate 15, height 5\' 7"  (1.702 m), weight 91.2 kg, SpO2 99 %.  The patient is sleeping at this time.  Reattempt rest of medical work-up including lactic acid, urine, chest x-ray and COVID in the morning.  Should also recheck temperature.  There have been no acute events since the last update.  Awaiting disposition plan from Behavioral Medicine team.   Irean Hong, MD 11/11/18 [phone]

## 2018-11-11 NOTE — ED Notes (Signed)
Pt. Requested and was given permission to take shower.  Pt. Given wash cloth, wash soap and towel.

## 2018-11-11 NOTE — BH Assessment (Signed)
Patient has been accepted to [facility].  Patient assigned to [facility] Accepting physician is Dr. Estill Cotta. Call report to [phone].  Representative was [employer].  ER Staff is aware of it:  Rivka Barbara, ER Secretary  Dr. Marisa Severin, ER MD  Amy B., Patient's Nurse     Patient bed will be available tomorrow (11/12/2018) any time after 8am  Address: [address]  [place], [place] [address]

## 2018-11-11 NOTE — ED Notes (Signed)
Patient lying on right side on bed.  I asked if he was doing ok and he said yes.

## 2018-11-11 NOTE — ED Notes (Signed)
Patient refused shower at this time wanting to talk with doctor before taking one explained to patient it could be hours patient understands

## 2018-11-11 NOTE — BH Assessment (Signed)
Referral information for Psychiatric Hospitalization faxed to;   [facility] ([phone]), Declined, no acute beds are available.  [facility] [phone]), Pending review.  [facility] [phone]),  . [facility] ([phone]), Declined  . [place] [phone]),  . [facility] [phone], [phone], [phone] or [phone]),   . [place] [phone] or [phone]),  . [facility] [phone]),   . [facility] [phone]),   . [facility] [phone] or [phone]),  . [facility] [phone]).

## 2018-11-11 NOTE — ED Notes (Signed)
IVC/ Consult completed/Accepted to [facility] [place] 

## 2018-11-11 NOTE — ED Notes (Signed)
Pt. Finished taking shower, pt. Assisted in throwing away old scrubs and picking up used towels and wash clothes and putting them in hamper.  Pt. Calm and cooperative.

## 2018-11-12 NOTE — ED Provider Notes (Signed)
-----------------------------------------   8:48 AM on 11/12/2018 -----------------------------------------   Blood pressure (!) 105/55, pulse 70, temperature 98.5 F (36.9 C), resp. rate 18, height 5\' 7"  (1.702 m), weight 91.2 kg, SpO2 100 %.  The patient is calm and cooperative at this time.  There have been no acute events since the last update.  In no acute distress ambulating with no difficulty disposition via the psych team at this time    Schuyler Amor, MD 11/12/18 [phone]

## 2018-11-12 NOTE — ED Notes (Signed)
emtala reviewed by this RN 

## 2020-10-10 IMAGING — DX RIGHT FOOT COMPLETE - 3+ VIEW
3 series · 3 of 3 positions shown · non-contrast
Comparison: None

CLINICAL DATA: Slipped and fell while walking his dog, RIGHT foot
pain

EXAM:
RIGHT FOOT COMPLETE - 3+ VIEW

[foot ap]
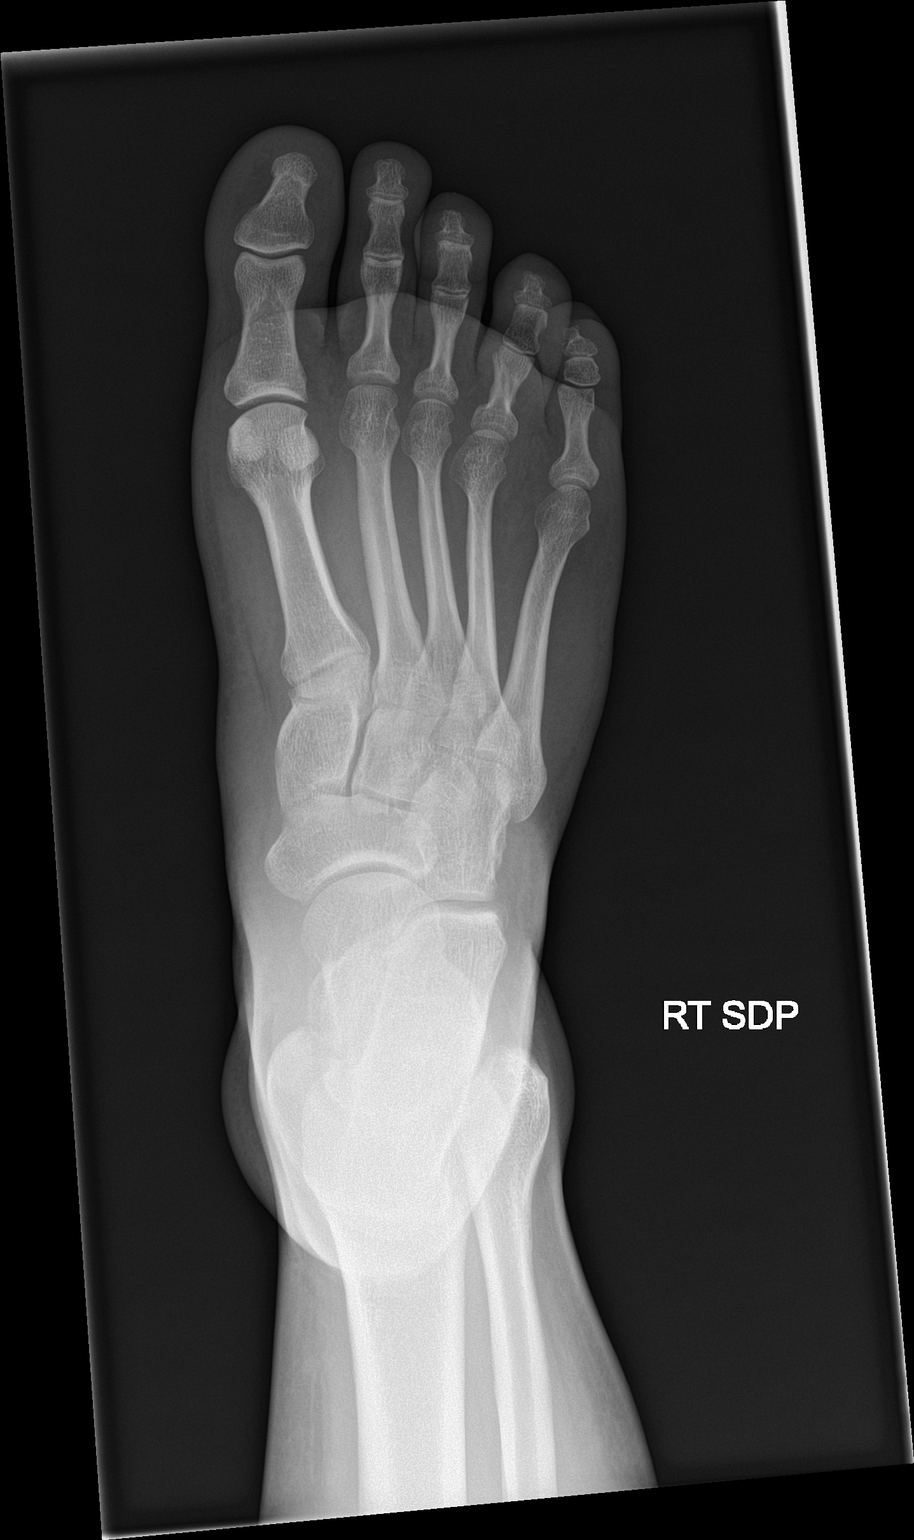

[foot obl]
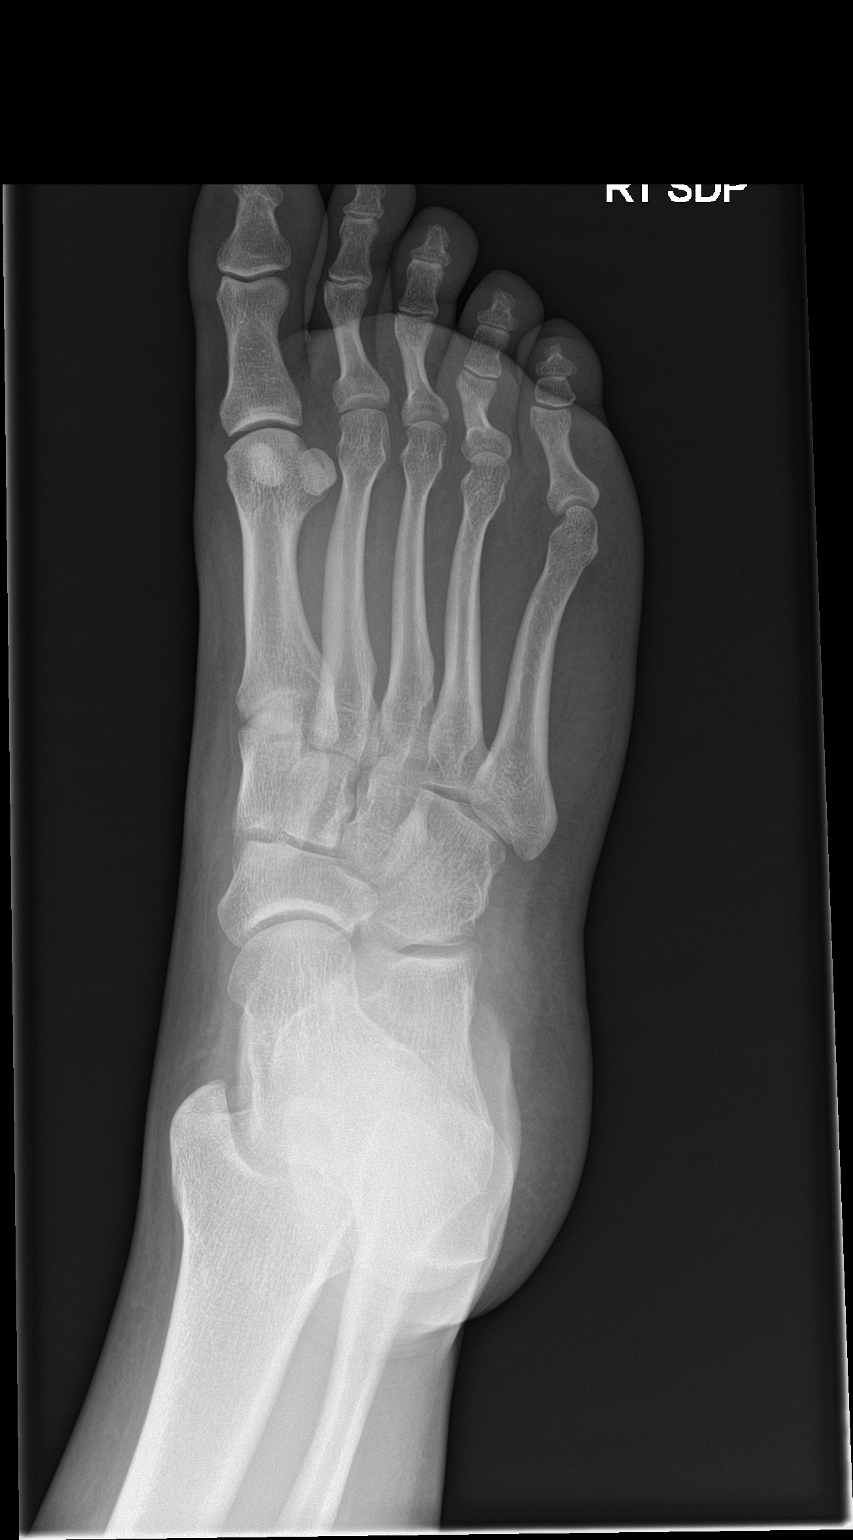

[foot lat]
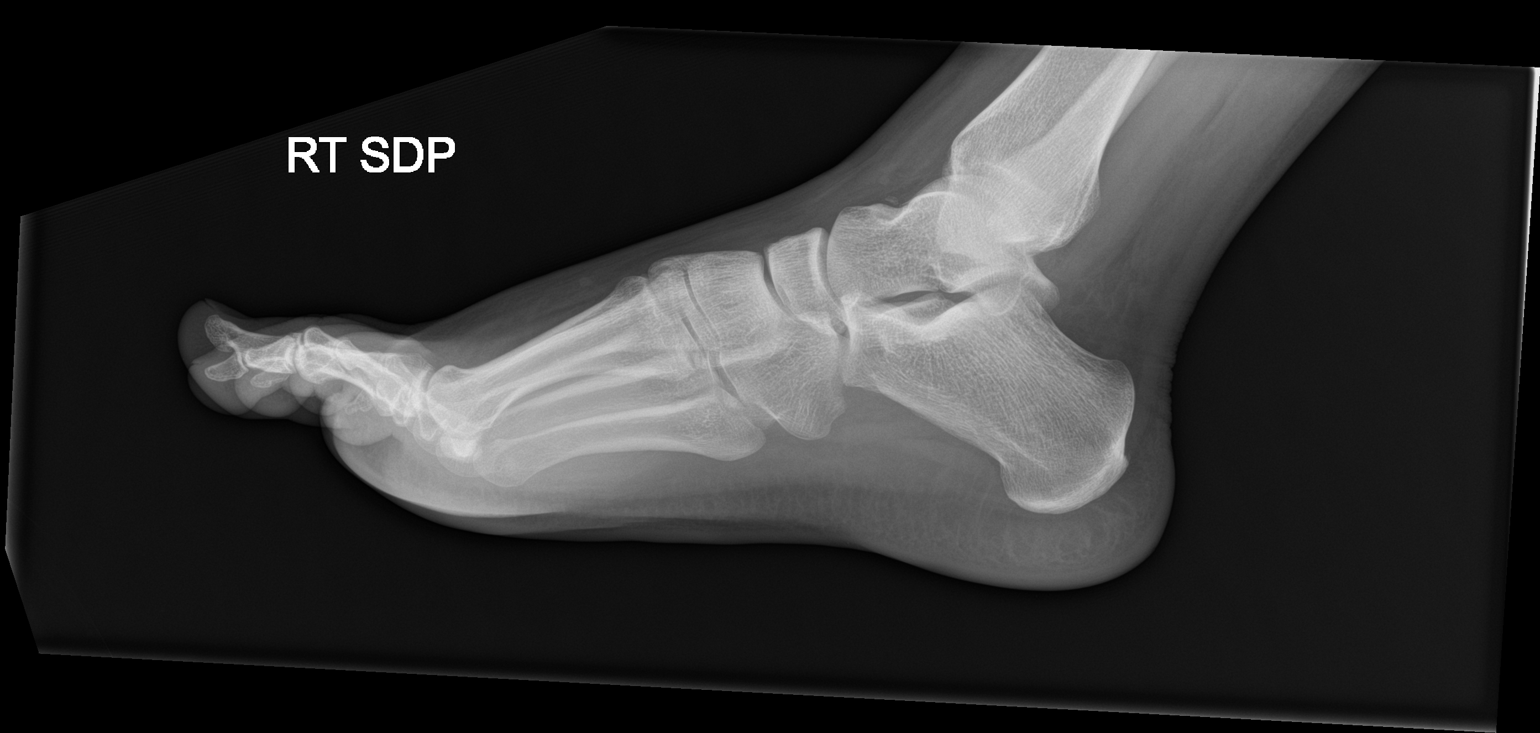

[3 of 3 positions shown; findings below may reference images not displayed]

FINDINGS: Osseous mineralization normal.

Joint spaces preserved.

Oblique fracture through shaft of proximal phalanx RIGHT fourth toe,
age-indeterminate, potentially old.

No additional fracture, dislocation, or bone destruction.
IMPRESSION: Age-indeterminate oblique fracture at proximal phalanx RIGHT fourth
toe, potentially old; recommend correlation for pain and tenderness
at this site to determine if this represents an acute injury.

No other focal osseous abnormalities identified.
